# Patient Record
Sex: Male | Born: 1980 | Hispanic: No | Marital: Single
Health system: Midwestern US, Community
[De-identification: ages and names within clinical notes are randomized; demographics above are authoritative.]

---

## 2016-11-18 IMAGING — DX DX Knee Complete- 4 or More Views RT
1 series · 4 of 4 positions shown · non-contrast
Comparison: None

Right knee 4 views
INDICATION: Medial pain, injury

[Series 4504: AP · right · 0.20mm/px · 4 of 4 slices shown]
[im 1/4]
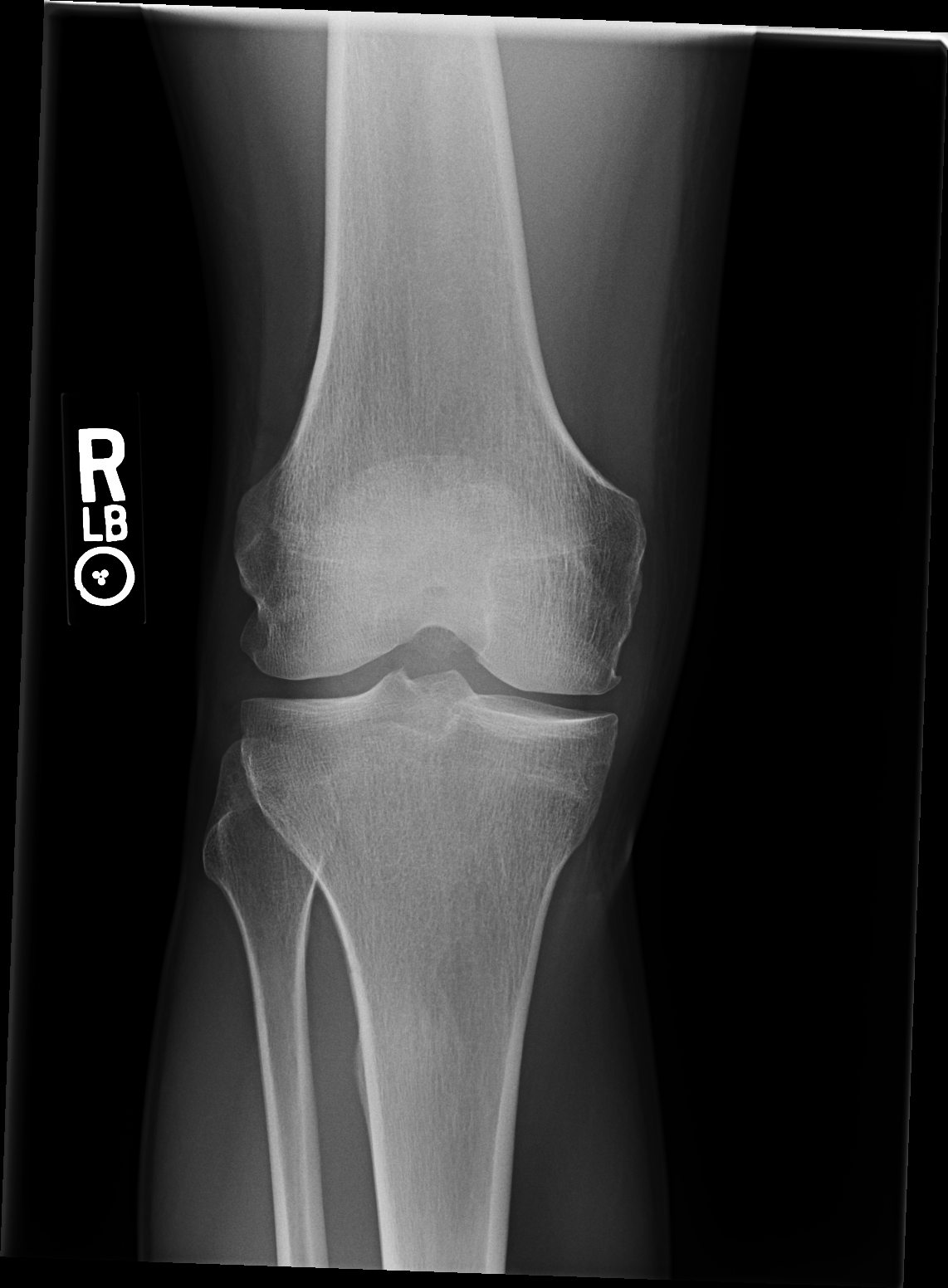
[im 2/4]
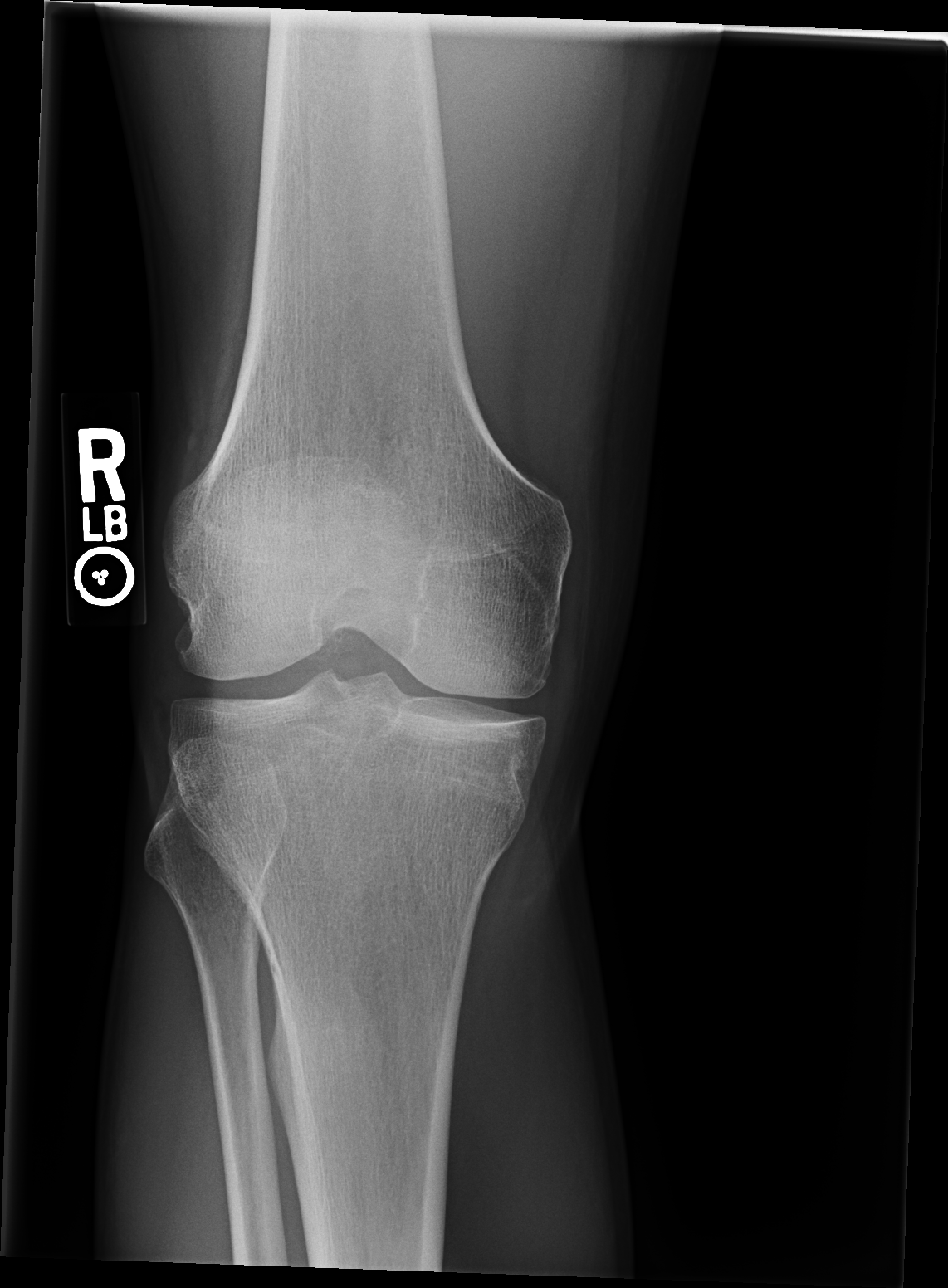
[im 3/4]
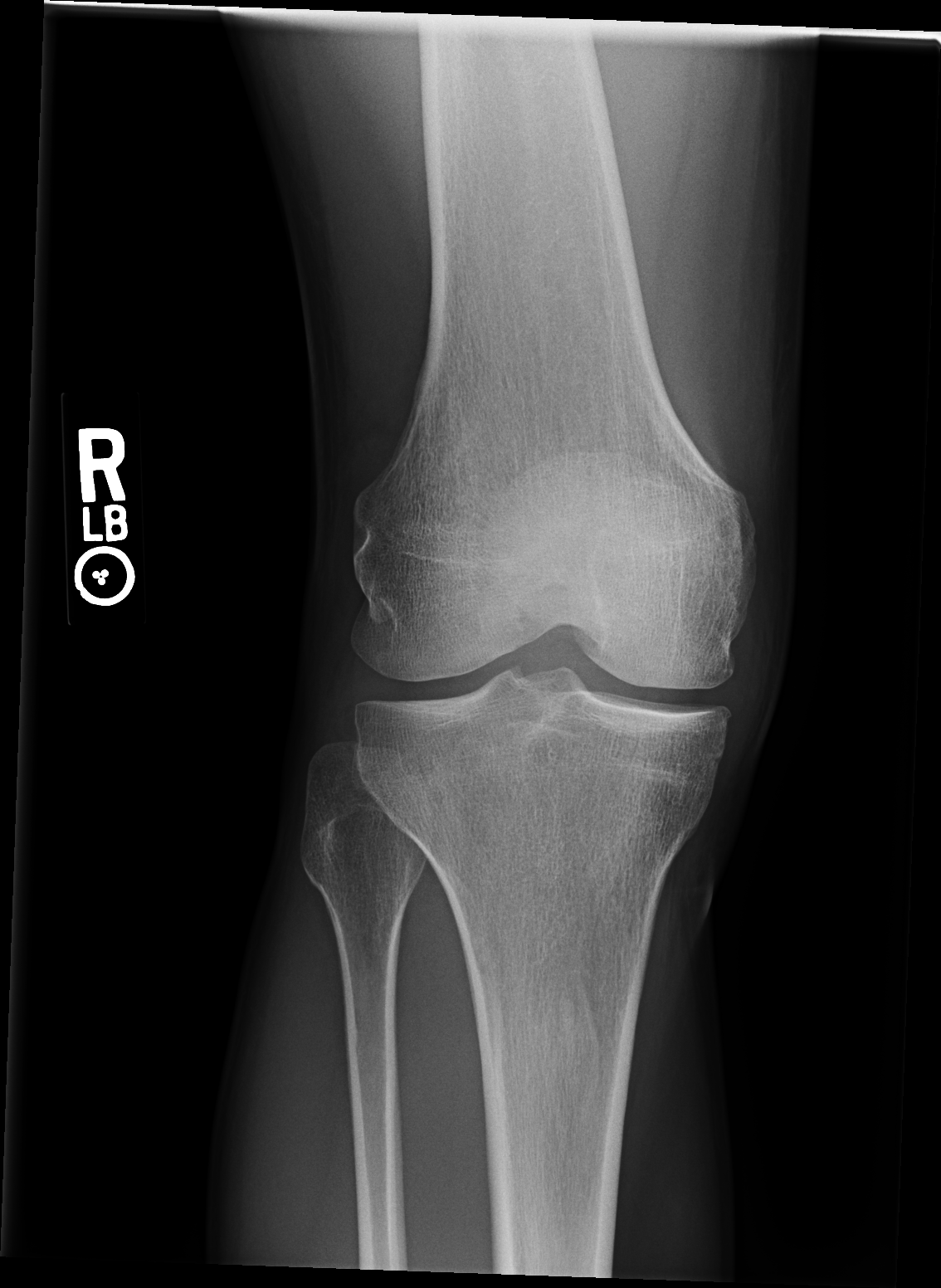
[im 4/4]
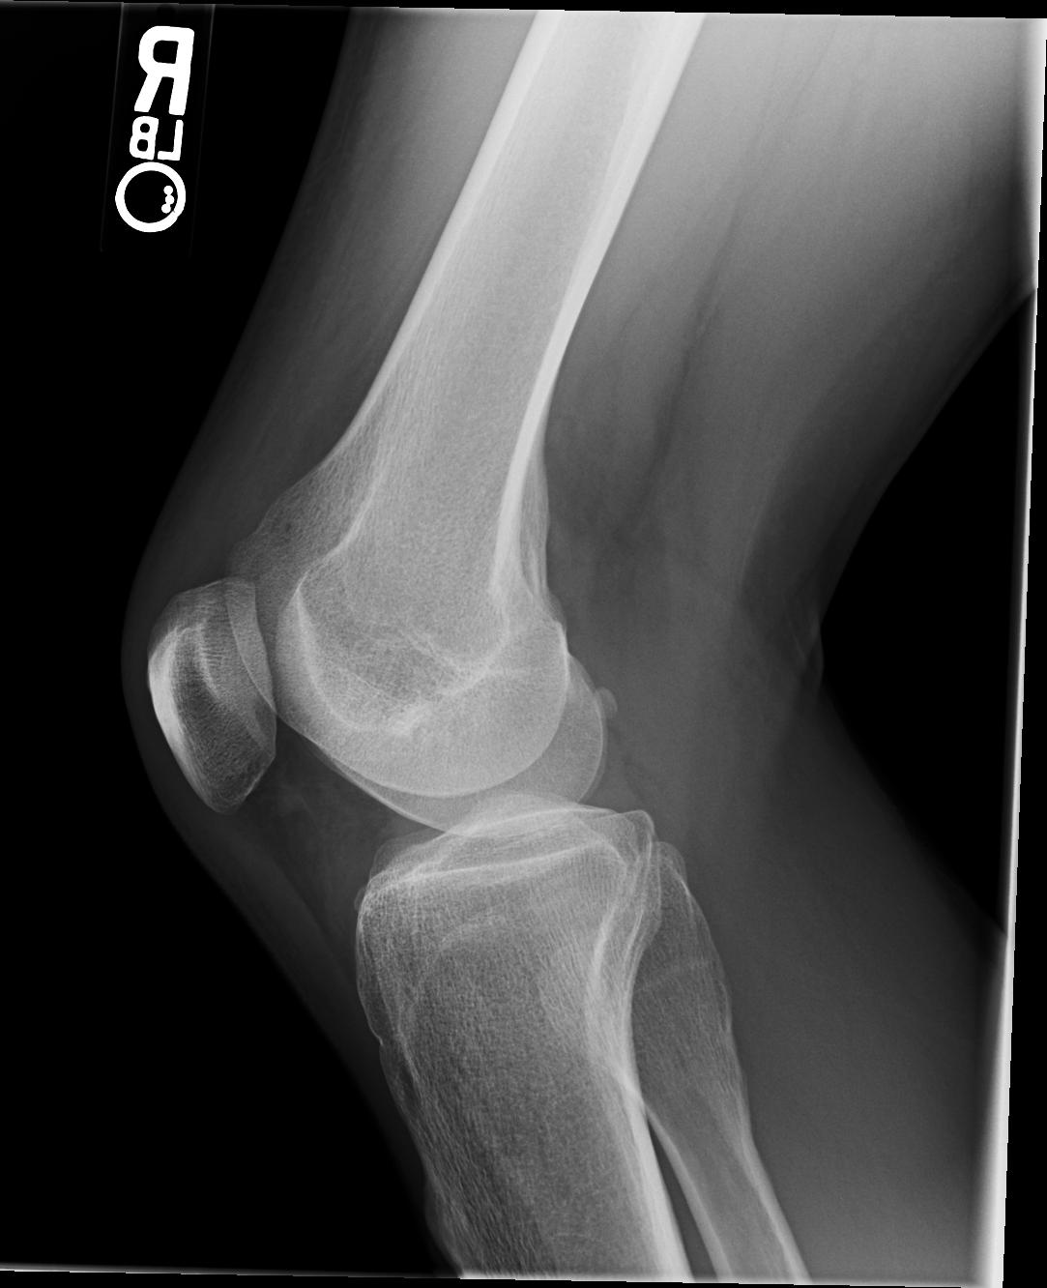

[4 of 4 positions shown; findings below may reference images not displayed]

FINDINGS: No acute fracture or dislocation.                                                         
 No sizable knee joint effusion. Minimal nonspecific infiltration of Hoffa's fat pad.      
 Mild degenerative changes in the medial compartment.

## 2019-04-08 ENCOUNTER — Inpatient Hospital Stay
Admit: 2019-04-08 | Discharge: 2019-04-09 | Disposition: A | Discharge status: Home / Self Care | Attending: Emergency Medicine

## 2019-04-08 DIAGNOSIS — L089 Local infection of the skin and subcutaneous tissue, unspecified: Secondary | ICD-10-CM

## 2019-04-08 NOTE — ED Notes (Signed)
Bed: 35  Expected date:   Expected time:   Means of arrival:   Comments:  triage     Janene Madeira, RN  04/08/19 2056

## 2019-04-08 NOTE — ED Provider Notes (Signed)
[facility] EMERGENCY DEPT  EMERGENCY DEPARTMENT ENCOUNTER      Pt Name: Dylan Burnett  MRN: [id number]  Birthdate 28-Sep-1980  Date of evaluation: 04/08/2019  Provider: Victory Dakin, APRN - CNP     I have evaluated this patient on my own, per my scope of practice with attending physician available for consultation    Due to concern for COVID-19 in the healthcare setting I wore protective eyewear, N95 respirator and surgical mask for the entirety of the encounter.     CHIEF COMPLAINT       Chief Complaint   Patient presents with   ??? Laceration     lac to left upper leg while at work, this will be under workers comp, will need form         HISTORY OF PRESENT ILLNESS   (Location/Symptom, Timing/Onset,Context/Setting, Quality, Duration, Modifying Factors, Severity) Note limiting factors.   HPI    Dylan Burnett is a 39 y.o. male who presents to the emergency department for evaluation of wound check to the LEFT upper leg. Patient tells me he had a work injury where he hit his leg on a saw. It cut through his pants causing abrasion to the LEFT posterior thigh with associated bruising. States the area is painful, swollen. Denies any bleeding. This occurred 2 days ago. His tetanus is up-to-date. Denies any sensation changes or bleeding.    Nursing Notes were reviewed.    REVIEW OFSYSTEMS    (2+ for level 4; 10+ level 5)   Review of Systems    All other systems reviewed and are negative except as noted in history of present illness    PAST MEDICAL HISTORY     Past Medical History:   Diagnosis Date   ??? Depression        SURGICAL HISTORY     History reviewed. No pertinent surgical history.    CURRENT MEDICATIONS       Previous Medications    No medications on file       ALLERGIES     Lamotrigine and Seroquel [quetiapine]    FAMILY HISTORY     History reviewed. No pertinent family history.     SOCIAL HISTORY       Social History     Socioeconomic History   ??? Marital status: Single     Spouse name: None   ??? Number of children: None    ??? Years of education: None   ??? Highest education level: None   Occupational History   ??? None   Social Needs   ??? Financial resource strain: None   ??? Food insecurity     Worry: None     Inability: None   ??? Transportation needs     Medical: None     Non-medical: None   Tobacco Use   ??? Smoking status: Current Every Day Smoker     Packs/day: 1.00   ??? Smokeless tobacco: Never Used   ??? Tobacco comment: 2 cigs -2 packs/day   Substance and Sexual Activity   ??? Alcohol use: Yes     Comment: occ   ??? Drug use: Not Currently   ??? Sexual activity: None   Lifestyle   ??? Physical activity     Days per week: None     Minutes per session: None   ??? Stress: None   Relationships   ??? Social Wellsite geologist on phone: None     Gets together: None  Attends religious service: None     Active member of club or organization: None     Attends meetings of clubs or organizations: None     Relationship status: None   ??? Intimate partner violence     Fear of current or ex partner: None     Emotionally abused: None     Physically abused: None     Forced sexual activity: None   Other Topics Concern   ??? None   Social History Narrative   ??? None       SCREENINGS           PHYSICAL EXAM    (up to 7 for level 4, 8 or more for level 5)     ED Triage Vitals [04/08/19 1905]   BP Temp Temp Source Pulse Resp SpO2 Height Weight   114/82 99.6 ??F (37.6 ??C) Temporal 73 18 99 % -- --       Physical Exam  Vitals signs and nursing note reviewed.   Constitutional:       General: He is not in acute distress.     Appearance: He is well-developed. He is not toxic-appearing or diaphoretic.   HENT:      Head: Normocephalic and atraumatic.   Eyes:      Conjunctiva/sclera: Conjunctivae normal.   Neck:      Musculoskeletal: Normal range of motion and neck supple.   Cardiovascular:      Rate and Rhythm: Normal rate and regular rhythm.   Pulmonary:      Effort: Pulmonary effort is normal. No respiratory distress.   Musculoskeletal: Normal range of motion.          General: No deformity.   Skin:     General: Skin is warm and dry.      Comments: For severe linear abrasion to the LEFT posterior thigh. It is approximately 1 mm deep, does not require suture repair. There is surrounding ecchymosis developing erythema. Tender to touch.   Neurological:      Mental Status: He is alert and oriented to person, place, and time.   Psychiatric:         Behavior: Behavior normal.         Thought Content: Thought content normal.         Judgment: Judgment normal.         DIAGNOSTIC RESULTS     EKG (Per Emergency Physician): All EKG's areinterpreted by the Emergency Department Physician in the absence of a cardiologist. Please see their note for interpretation of EKG    RADIOLOGY (Per Emergency Physician):     Interpretation per the Radiologist below, if available at the time ofthis note:  No results found.    ED BEDSIDE ULTRASOUND:   Performed by ED Physician - none    LABS:  Labs Reviewed - No data to display     All other labs were within normal range or not returned as of this dictation.    EMERGENCY DEPARTMENT COURSE and DIFFERENTIAL DIAGNOSIS/MDM:   Vitals:    Vitals:    04/08/19 1905   BP: 114/82   Pulse: 73   Resp: 18   Temp: 99.6 ??F (37.6 ??C)   TempSrc: Temporal   SpO2: 99%       Medications   cephALEXin (KEFLEX) capsule 500 mg (has no administration in time range)   ibuprofen (ADVIL;MOTRIN) tablet 600 mg (has no administration in time range)       MDM.  Patient  is a 39 year old male presenting for a wound check to the LEFT posterior thigh. This is work-related. His tetanus is already up-to-date. He is given ibuprofen and started on Keflex for concern for early secondary cellulitis. He is to follow-up with the occupational health clinic, keep wound clean and dry, maintain dressing with bacitracin treatment.         REVAL:         CRITICAL CARE TIME   Total Critical Care time was  minutes, excluding separately reportable procedures.  There was a high probability of clinically  significant/life threatening deteriorationin the patient's condition which required my urgent intervention.     CONSULTS:  None    :  Unless otherwise noted below, none     Procedures    FINAL IMPRESSION      1. Infected abrasion of left lower extremity, initial encounter          DISPOSITION/PLAN   DISPOSITION Decision To Discharge 04/08/2019 09:27:40 PM      PATIENT REFERRED TO:  [facility] - Main Office  [address]  [place]  [place] [address]  [phone]  Call in 3 days        DISCHARGE MEDICATIONS:  New Prescriptions    CEPHALEXIN (KEFLEX) 500 MG CAPSULE    Take 1 capsule by mouth 3 times daily for 7 days    IBUPROFEN (ADVIL;MOTRIN) 600 MG TABLET    Take 1 tablet by mouth every 6 hours as needed for Pain          (Please note:  Portions of this note were completed with a voice recognition program.  Efforts were made to edit the dictations but occasionally words and phrases are mis-transcribed.)  Form [id number]    Victory Dakin, APRN - CNP  [employer]        Victory Dakin, APRN - [facility]  04/08/19 2132

## 2019-04-08 NOTE — Discharge Instructions (Signed)
Return to Work Form [facility] Emergency Department (ED)  [x]  [place] [phone]  []  [phone]  []  [place] Ream [phone]  []  [place]  []  [phone]       *Show this Return to Work form to your work [employer] immediately.  It is your employer's responsibility to determine if restrictions can be accommodated.    Today's Date: 04/08/19       Patient Name: Dylan Burnett, Dylan Burnett  DOB: Sep 22, 1980     Employee may return to work no restrictions on (Date): 04/09/19        []  No bending      []  No twisting/turning    []  No squatting      []  No climbing    []  No prolonged sitting     []  No standing longer than __ minutes    []  No use of __ hand/arm/shoulder    []  No pushing greater than __ pounds    []  No pulling greater than __ pounds    []  No lifting greater than __ pounds    []  No reaching above shoulder height    []  Do not operate safety sensitive machinery    []  Keep bandage/splint clean and dry    Other Restrictions/Comments: None      ED Provider Signature:   _______________________________                         No att. providers found    Work injuries require treatment by a [facility] certified provider. If follow-up care is needed please call one of the [facility] locations below.    [facility] [place]: [phone] [address], [place],   [place]: [phone]   [address], [place],   [facility]:  [address] [place],   [place]: [phone]  [address], [place], [address] [place]  [place]:  [phone]   [address], [place], [address] [place]

## 2019-04-08 NOTE — ED Notes (Signed)
Discharge instructions discussed with patient, receptive and stable. Patient advised to follow up with pcp as needed, prescriptions for keflex and ibuprofen discussed. Bacitracin applied to wound on left leg with telfa and gauze wrap per order. Patient ambulatory out of er in stable condition by self.      Ames Coupe, RN  04/08/19 2155

## 2019-04-08 NOTE — ED Provider Notes (Signed)
Emergency Department Encounter  [facility] EMERGENCY DEPT    Patient: Dylan Burnett  MRN: [id number]  DOB: 12-Dec-1980  Date of Evaluation: 04/08/2019  ED Provider: Flonnie Hailstone, PA    As the APP-in-triage, I performed a medical screening history and physical exam on this patient.  An N95 and gloves were worn during the entirety of this encounter.    HISTORY OF PRESENT ILLNESS  In brief, Dylan Burnett is a 39 y.o. 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  Did not resent to be evaluated at that time.  Has been becoming more painful since.  Has been keeping covered.  Once 5 workers comp.  Tetanus is up-to-date.     PHYSICAL EXAM  ED Triage Vitals [04/08/19 1905]   Enc Vitals Group      BP 114/82      Pulse 73      Resp 18      Temp 99.6 ??F (37.6 ??C)      Temp Source Temporal      SpO2 99 %      Weight       Height       Head Circumference       Peak Flow       Pain Score       Pain Loc       Pain Edu?       Excl. in GC?        On brief exam, vital signs are reassuring.  Patient is afebrile and nontoxic in appearance.  Head is normocephalic and atraumatic    patient has a 2 inch lesion to the posterior left thigh.  Surrounding ecchymoses, tender to palpation, no obvious laceration, erythematous, may need antibiotics.  Workers comp patient.    We will initiate diagnostics/treatments as indicated and place in main ED as soon as available.    Dylan Burnett  Flonnie Hailstone, PA  [employer]      [place], [place]  04/08/19 Dylan Burnett

## 2019-04-09 MED ORDER — CEPHALEXIN 500 MG PO CAPS
500 MG | Freq: Once | ORAL | Status: AC
Start: 2019-04-09 — End: 2019-04-08
  Administered 2019-04-09: 03:00:00 500 mg via ORAL

## 2019-04-09 MED ORDER — IBUPROFEN 600 MG PO TABS
600 MG | ORAL_TABLET | Freq: Four times a day (QID) | ORAL | 0 refills | Status: AC | PRN
Start: 2019-04-09 — End: ?

## 2019-04-09 MED ORDER — BACITRACIN 500 UNIT/GM EX OINT
500 UNIT/GM | Freq: Once | CUTANEOUS | Status: AC
Start: 2019-04-09 — End: 2019-04-08
  Administered 2019-04-09: 03:00:00 via TOPICAL

## 2019-04-09 MED ORDER — IBUPROFEN 600 MG PO TABS
600 MG | Freq: Once | ORAL | Status: AC
Start: 2019-04-09 — End: 2019-04-08
  Administered 2019-04-09: 03:00:00 600 mg via ORAL

## 2019-04-09 MED ORDER — CEPHALEXIN 500 MG PO CAPS
500 MG | ORAL_CAPSULE | Freq: Three times a day (TID) | ORAL | 0 refills | Status: AC
Start: 2019-04-09 — End: 2019-04-15

## 2019-04-09 MED FILL — IBUPROFEN 600 MG PO TABS: 600 mg | ORAL | Qty: 1

## 2019-04-09 MED FILL — BACITRACIN 500 UNIT/GM EX OINT: 500 UNIT/GM | CUTANEOUS | Qty: 1

## 2019-04-09 MED FILL — CEPHALEXIN 500 MG PO CAPS: 500 mg | ORAL | Qty: 1
